# Patient Record
Sex: Female | Born: 1990
Health system: Southern US, Community
[De-identification: ages and names within clinical notes are randomized; demographics above are authoritative.]

## PROBLEM LIST (undated history)

## (undated) ENCOUNTER — Inpatient Hospital Stay (HOSPITAL_COMMUNITY): Payer: 59

---
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
## 2009-03-17 ENCOUNTER — Emergency Department (HOSPITAL_COMMUNITY): Admission: EM | Admit: 2009-03-17 | Discharge: 2009-03-17 | Payer: Self-pay | Admitting: Emergency Medicine

## 2010-07-13 LAB — URINALYSIS, ROUTINE W REFLEX MICROSCOPIC
Glucose, UA: NEGATIVE mg/dL
Ketones, ur: NEGATIVE mg/dL
Nitrite: NEGATIVE
Specific Gravity, Urine: 1.021 (ref 1.005–1.030)

## 2010-07-13 LAB — COMPREHENSIVE METABOLIC PANEL
ALT: 14 U/L (ref 0–35)
AST: 19 U/L (ref 0–37)
BUN: 8 mg/dL (ref 6–23)
CO2: 28 mEq/L (ref 19–32)
Calcium: 9.2 mg/dL (ref 8.4–10.5)
GFR calc Af Amer: >60 mL/min (ref >60)
GFR calc non Af Amer: >60 mL/min (ref >60)
Sodium: 137 mEq/L (ref 135–145)
Total Bilirubin: 1.2 mg/dL (ref 0.3–1.2)

## 2010-07-13 LAB — CBC
MCHC: 34.2 g/dL (ref 30.0–36.0)
RBC: 4.88 MIL/uL (ref 3.87–5.11)

## 2010-07-13 LAB — DIFFERENTIAL
Eosinophils Absolute: 0.2 10*3/uL (ref 0.0–0.7)
Eosinophils Relative: 4 % (ref 0–5)
Lymphocytes Relative: 18 % (ref 12–46)
Neutro Abs: 3.6 10*3/uL (ref 1.7–7.7)

## 2012-08-27 ENCOUNTER — Emergency Department (HOSPITAL_COMMUNITY)
Admission: EM | Admit: 2012-08-27 | Discharge: 2012-08-27 | Disposition: A | Discharge status: Home / Self Care | Payer: BC Managed Care – PPO | Attending: Emergency Medicine | Admitting: Emergency Medicine

## 2012-08-27 ENCOUNTER — Emergency Department (HOSPITAL_COMMUNITY): Payer: BC Managed Care – PPO

## 2012-08-27 DIAGNOSIS — Y9389 Activity, other specified: Secondary | ICD-10-CM | POA: Insufficient documentation

## 2012-08-27 DIAGNOSIS — S8990XA Unspecified injury of unspecified lower leg, initial encounter: Secondary | ICD-10-CM | POA: Insufficient documentation

## 2012-08-27 DIAGNOSIS — Y92410 Unspecified street and highway as the place of occurrence of the external cause: Secondary | ICD-10-CM | POA: Insufficient documentation

## 2012-08-27 DIAGNOSIS — R079 Chest pain, unspecified: Secondary | ICD-10-CM

## 2012-08-27 DIAGNOSIS — S59909A Unspecified injury of unspecified elbow, initial encounter: Secondary | ICD-10-CM | POA: Insufficient documentation

## 2012-08-27 DIAGNOSIS — M25531 Pain in right wrist: Secondary | ICD-10-CM

## 2012-08-27 DIAGNOSIS — M25571 Pain in right ankle and joints of right foot: Secondary | ICD-10-CM

## 2012-08-27 DIAGNOSIS — S6990XA Unspecified injury of unspecified wrist, hand and finger(s), initial encounter: Secondary | ICD-10-CM | POA: Insufficient documentation

## 2012-08-27 MED ORDER — HYDROCODONE-ACETAMINOPHEN 5-325 MG PO TABS
2.0000 | ORAL_TABLET | Freq: Once | ORAL | Status: AC
  Administered 2012-08-27: 2 via ORAL
  Filled 2012-08-27: qty 2

## 2012-08-27 MED ORDER — IBUPROFEN 600 MG PO TABS: 600.0000 mg | ORAL_TABLET | Freq: Four times a day (QID) | ORAL | Status: DC | PRN
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
## 2012-08-27 NOTE — ED Notes (Signed)
Pt escorted to discharge window. Pt verbalized understanding discharge instructions. In no acute distress.  

## 2012-08-27 NOTE — Progress Notes (Signed)
[facility] has seen patient and provided her with a list of primary care resources. °
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
## 2012-08-28 NOTE — ED Provider Notes (Signed)
Medical screening examination/treatment/procedure(s) were performed by non-physician practitioner and as supervising physician I was immediately available for consultation/collaboration.   Jafar Poffenberger R Colene [place]ines, MD 08/28/12 1611 

## 2014-12-16 ENCOUNTER — Encounter (HOSPITAL_COMMUNITY): Payer: Self-pay | Admitting: *Deleted

## 2014-12-16 ENCOUNTER — Other Ambulatory Visit (HOSPITAL_COMMUNITY)
Admission: RE | Admit: 2014-12-16 | Discharge: 2014-12-16 | Disposition: A | Discharge status: Home / Self Care | Payer: Self-pay | Source: Ambulatory Visit | Attending: Family Medicine | Admitting: Family Medicine

## 2014-12-16 ENCOUNTER — Emergency Department (INDEPENDENT_AMBULATORY_CARE_PROVIDER_SITE_OTHER)
Admission: EM | Admit: 2014-12-16 | Discharge: 2014-12-16 | Disposition: A | Discharge status: Home / Self Care | Payer: Self-pay | Source: Home / Self Care | Attending: Family Medicine | Admitting: Family Medicine

## 2014-12-16 DIAGNOSIS — Z113 Encounter for screening for infections with a predominantly sexual mode of transmission: Secondary | ICD-10-CM | POA: Insufficient documentation

## 2014-12-16 DIAGNOSIS — B9689 Other specified bacterial agents as the cause of diseases classified elsewhere: Secondary | ICD-10-CM

## 2014-12-16 DIAGNOSIS — A499 Bacterial infection, unspecified: Secondary | ICD-10-CM

## 2014-12-16 DIAGNOSIS — N760 Acute vaginitis: Secondary | ICD-10-CM

## 2014-12-16 MED ORDER — METRONIDAZOLE 500 MG PO TABS: 500.0000 mg | ORAL_TABLET | Freq: Two times a day (BID) | ORAL | Status: DC

## 2014-12-16 NOTE — ED Provider Notes (Addendum)
CSN: [id number]     Arrival date & time 12/16/14  1727 History   First MD Initiated Contact with Patient 12/16/14 1848     Chief Complaint  Patient presents with  . Vaginal Discharge   (Consider location/radiation/quality/duration/timing/severity/associated sxs/prior Treatment) Patient is a 24 y.o. female presenting with vaginal discharge. The history is provided by the patient.  Vaginal Discharge Quality:  Malodorous Severity:  Mild Onset quality:  Gradual Duration:  2 weeks Progression:  Unchanged Chronicity:  Recurrent Context comment:  Similar sx of bv in past. Relieved by:  None tried Worsened by:  Nothing tried Ineffective treatments:  None tried Associated symptoms: no abdominal pain, no dysuria, no fever, no genital lesions and no nausea   Risk factors comment:  S/p recent abortion procedure.   History reviewed. No pertinent past medical history. History reviewed. No pertinent past surgical history. History reviewed. No pertinent family history. Social History  Substance Use Topics  . Smoking status: None  . Smokeless tobacco: None  . Alcohol Use: No   OB History    No data available     Review of Systems  Constitutional: Negative for fever.  Gastrointestinal: Negative for nausea and abdominal pain.  Genitourinary: Positive for vaginal discharge. Negative for dysuria, urgency, vaginal bleeding and pelvic pain.  All other systems reviewed and are negative.   Allergies  Review of patient's allergies indicates no known allergies.  Home Medications   Prior to Admission medications   Medication Sig Start Date End Date Taking? Authorizing Provider  ibuprofen (ADVIL,MOTRIN) 600 MG tablet Take 1 tablet (600 mg total) by mouth every 6 (six) hours as needed for pain. 08/27/12   Junius Finner, PA-C  metroNIDAZOLE (FLAGYL) 500 MG tablet Take 1 tablet (500 mg total) by mouth 2 (two) times daily. 12/16/14   Linna Hoff, MD   Meds Ordered and Administered this Visit   Medications - No data to display  BP 107/74 mmHg  Pulse 69  Temp(Src) 99.2 F (37.3 C) (Oral)  Resp 16  SpO2 98%  LMP 12/02/2014 No data found.   Physical Exam  Constitutional: She is oriented to person, place, and time. She appears well-developed and well-nourished.  Abdominal: Soft. Bowel sounds are normal. There is no tenderness.  Genitourinary: Uterus normal. Pelvic exam was performed with patient supine. Cervix exhibits discharge. Right adnexum displays no tenderness. Left adnexum displays no tenderness. No tenderness in the vagina. Vaginal discharge found.  Neurological: She is alert and oriented to person, place, and time.  Skin: Skin is warm and dry.  Nursing note and vitals reviewed.   ED Course  Procedures (including critical care time)  Labs Review Labs Reviewed  CERVICOVAGINAL ANCILLARY ONLY    Imaging Review No results found.   Visual Acuity Review  Right Eye Distance:   Left Eye Distance:   Bilateral Distance:    Right Eye Near:   Left Eye Near:    Bilateral Near:         MDM   1. BV (bacterial vaginosis)    rx with flagyl for bv.    Linna Hoff, MD 12/16/14 [place]  Linna Hoff, MD 12/16/14 [phone]

## 2014-12-16 NOTE — ED Notes (Signed)
Pt  Reports   Symptoms  Of  Vaginal  Discharge  And  Odor  X  2  Weeks

## 2014-12-17 LAB — CERVICOVAGINAL ANCILLARY ONLY
Chlamydia: NEGATIVE
Neisseria Gonorrhea: NEGATIVE
Wet Prep (BD Affirm): POSITIVE — AB

## 2014-12-17 NOTE — ED Notes (Addendum)
Attempted to call patient to advise of test results. Positive for gardnerella only, negative for GC/chlamydis, trichomonas. Treatment adequate w Rx provided day of visit.  VM box is full and unable to leave a message.

## 2015-06-15 ENCOUNTER — Encounter (HOSPITAL_COMMUNITY): Payer: Self-pay | Admitting: *Deleted

## 2015-06-15 ENCOUNTER — Emergency Department (HOSPITAL_COMMUNITY)
Admission: EM | Admit: 2015-06-15 | Discharge: 2015-06-15 | Disposition: A | Discharge status: Home / Self Care | Payer: Self-pay | Attending: Emergency Medicine | Admitting: Emergency Medicine

## 2015-06-15 DIAGNOSIS — Z3491 Encounter for supervision of normal pregnancy, unspecified, first trimester: Secondary | ICD-10-CM | POA: Insufficient documentation

## 2015-06-15 DIAGNOSIS — Z3A01 Less than 8 weeks gestation of pregnancy: Secondary | ICD-10-CM | POA: Insufficient documentation

## 2015-06-15 NOTE — ED Notes (Signed)
Pt denies any pain or discharge, reports being approx 6 weeks 6 days pregnant and went for US today at pregnancy center. Pt was told there was no heartbeat on US and sent here for further eval. No acute distress noted at triage. Pt refused blooddraw at triage.

## 2015-06-15 NOTE — ED Notes (Signed)
No answer in waiting.  

## 2015-06-15 NOTE — ED Notes (Signed)
No answer x3

## 2015-06-18 ENCOUNTER — Inpatient Hospital Stay (HOSPITAL_COMMUNITY)
Admission: AD | Admit: 2015-06-18 | Discharge: 2015-06-18 | Disposition: A | Discharge status: Home / Self Care | Payer: Self-pay | Source: Ambulatory Visit | Attending: Obstetrics & Gynecology | Admitting: Obstetrics & Gynecology

## 2015-06-18 DIAGNOSIS — O26891 Other specified pregnancy related conditions, first trimester: Secondary | ICD-10-CM | POA: Insufficient documentation

## 2015-06-18 DIAGNOSIS — Z3200 Encounter for pregnancy test, result unknown: Secondary | ICD-10-CM

## 2015-06-18 DIAGNOSIS — Z3A01 Less than 8 weeks gestation of pregnancy: Secondary | ICD-10-CM | POA: Insufficient documentation

## 2015-06-18 NOTE — MAU Note (Addendum)
Patient presents stating that [facility] to confirm her pregnancy via +UPT. She then returned on March 15 for an ultrasound to confirm a heartbeat but they were unable to detect FHT's. Was instructed to seek medical help from a physician. [facility] and was registered but left when they wanted to draw blood. Denies pain, bleeding or discharge. Presents today for an U/S.

## 2015-06-18 NOTE — MAU Provider Note (Signed)
S:  Alexandria Ray is a 25 y.o. female No obstetric history on file. Presenting with to MAU requesting an US to detect fetal heart tones. The patient denies pain or vaginal bleeding at this time. The [facility] and was told that they could not detect fetal hear tones. The patient thinks she is about [redacted] weeks pregnant according to her LMP.   O:  GENERAL: Well-developed, well-nourished female in no acute distress.  LUNGS: Effort normal SKIN: Warm, dry and without erythema PSYCH: Normal mood and affect  Filed Vitals:   06/18/15 1417  BP: 116/68  Pulse: 84  Temp: 98.5 F (36.9 C)  TempSrc: Oral  Resp: 16  Height: 5\' 4"  (1.626 m)  Weight: 157 lb 4 oz (71.328 kg)    A:   Patient is physically well however worried about her pregnancy  P:  The patient is encouraged to call the [facility] on Monday to schedule an appointment for 1st prenatal appt.  If patient develops pain or bleeding, she is encouraged to come back to MAU for evaluation.   Duane LopeJennifer I Rasch, NP 06/18/2015 2:37 PM

## 2015-10-20 ENCOUNTER — Emergency Department (HOSPITAL_COMMUNITY)
Admission: EM | Admit: 2015-10-20 | Discharge: 2015-10-20 | Disposition: A | Discharge status: Home / Self Care | Payer: Self-pay | Attending: Dermatology | Admitting: Dermatology

## 2015-10-20 ENCOUNTER — Emergency Department (HOSPITAL_COMMUNITY): Payer: Self-pay

## 2015-10-20 ENCOUNTER — Encounter (HOSPITAL_COMMUNITY): Payer: Self-pay | Admitting: Emergency Medicine

## 2015-10-20 DIAGNOSIS — Z5321 Procedure and treatment not carried out due to patient leaving prior to being seen by health care provider: Secondary | ICD-10-CM | POA: Insufficient documentation

## 2015-10-20 DIAGNOSIS — R079 Chest pain, unspecified: Secondary | ICD-10-CM | POA: Insufficient documentation

## 2015-10-20 NOTE — ED Notes (Signed)
Pt. reports right chest pain radiating to upper back onset today with mild SOB , denies nausea or diaphoresis .

## 2015-10-20 NOTE — ED Notes (Signed)
Pt's name called, no answer. Pt is not seen in the lobby.  

## 2015-10-20 NOTE — ED Notes (Signed)
Patient was called but didn't answer.Marland Kitchen..Marland Kitchen

## 2016-03-10 ENCOUNTER — Encounter (HOSPITAL_COMMUNITY): Payer: Self-pay | Admitting: Emergency Medicine

## 2016-03-10 ENCOUNTER — Emergency Department (HOSPITAL_COMMUNITY)
Admission: EM | Admit: 2016-03-10 | Discharge: 2016-03-10 | Disposition: A | Discharge status: Home / Self Care | Payer: Self-pay | Attending: Emergency Medicine | Admitting: Emergency Medicine

## 2016-03-10 DIAGNOSIS — L259 Unspecified contact dermatitis, unspecified cause: Secondary | ICD-10-CM | POA: Insufficient documentation

## 2016-03-10 MED ORDER — PREDNISONE 20 MG PO TABS: ORAL_TABLET | ORAL | 0 refills | Status: DC

## 2016-03-10 MED ORDER — DIPHENHYDRAMINE HCL 25 MG PO TABS: 25.0000 mg | ORAL_TABLET | Freq: Four times a day (QID) | ORAL | 0 refills | Status: DC | PRN

## 2016-03-10 NOTE — Discharge Instructions (Signed)
Please avoid potential factors that may cause your skin sensitivity, such as new uniform.  Please follow up with an allergist for further evaluation of your condition.

## 2016-03-10 NOTE — ED Triage Notes (Signed)
Patient reports rash to central chest and left eye x1 month. States she first noticed the rash when she started her job x1 month ago. 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

## 2016-03-10 NOTE — ED Provider Notes (Signed)
[facility]-EMERGENCY DEPT Provider Note   CSN: [id number] Arrival date & time: 03/10/16  1503  By signing my name below, I, Teofilo PodMatthew P. Jamison, attest that this documentation has been prepared under the direction and in the presence of Fayrene HelperBowie Calia Napp, PA-C. Electronically Signed: Teofilo PodMatthew P. Jamison, ED Scribe. 03/10/2016. 4:50 PM.    History   Chief Complaint Chief Complaint  Patient presents with  . Rash    The history is provided by the patient. No language interpreter was used.   HPI Comments:  Alexandria Ray is a 25 y.o. female who presents to the Emergency Department complaining of a constant rash to her central chest and left eye x 5 days. Pt states that she first noticed the rash ~5 days ago on her chest. Pt states that her neck, right shoulder and right axilla are itchy, and her chest is in pain. Pt notes that her rash pain is exacerbated when trying to wash it and when touching the area. Pt complains of associated SOB, muscles aches near the rash areas, general fatigue, headache. 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 Pt also complains of lower back pain x 2 days. Pt denies having used any new soaps or detergents. Pt reports no previous similar allergic reactions. No alleviating factors noted. Pt denies joint pain, wheezing.   History reviewed. No pertinent past medical history.  There are no active problems to display for this patient.   History reviewed. No pertinent surgical history.  OB History    No data available       Home Medications    Prior to Admission medications   Medication Sig Start Date End Date Taking? Authorizing Provider  ibuprofen (ADVIL,MOTRIN) 600 MG tablet Take 1 tablet (600 mg total) by mouth every 6 (six) hours as needed for pain. 08/27/12   Junius FinnerErin O'Malley, PA-C  metroNIDAZOLE (FLAGYL) 500 MG tablet Take 1 tablet (500 mg total) by mouth 2  (two) times daily. 12/16/14   Linna HoffJames D Kindl, MD    Family History History reviewed. No pertinent family history.  Social History Social History  Substance Use Topics  . Smoking status: Never Smoker  . Smokeless tobacco: Never Used  . Alcohol use No     Allergies   Patient has no known allergies.   Review of Systems Review of Systems  Constitutional: Positive for fatigue.  Respiratory: Positive for shortness of breath. Negative for wheezing.   Musculoskeletal: Positive for back pain and myalgias. Negative for arthralgias.  Skin: Positive for rash.  Neurological: Positive for headaches.     Physical Exam Updated Vital Signs BP 114/74 (BP Location: Right Arm)   Pulse 87   Temp 97.8 F (36.6 C) (Oral)   Resp 18   LMP 03/02/2016   SpO2 100%   Physical Exam  Constitutional: She appears well-developed and well-nourished. No distress.  HENT:  Head: Normocephalic and atraumatic.  No oral or mucosal rash.   Eyes: Conjunctivae are normal.  Small mildly hyperpigmented lesion to lateral canthus of left eye without significant erythema.   Cardiovascular: Normal rate and regular rhythm.   Pulmonary/Chest: Effort normal and breath sounds normal.  Abdominal: She exhibits no distension.  Neurological: She is alert.  Skin: Skin is warm and dry.  Hyperpigmented abrasion noted to anterior child with mild TTP. Mild hyperpigmented lesion noted to right axillary fold, mildly TTP.   Psychiatric:  She has a normal mood and affect.  Nursing note and vitals reviewed.    ED Treatments / Results  DIAGNOSTIC STUDIES:  Oxygen Saturation is 100% on RA, normal by my interpretation.    COORDINATION OF CARE:  4:50 PM Discussed treatment plan with pt at bedside and pt agreed to plan.   Labs (all labs ordered are listed, but only abnormal results are displayed) Labs Reviewed - No data to display  EKG  EKG Interpretation None       Radiology No results  found.  Procedures Procedures (including critical care time)  Medications Ordered in ED Medications - No data to display   Initial Impression / Assessment and Plan / ED Course   Pt with sxs suggestive of contact dermatitis, likely from new uniform.    Instructed to avoid offending agent and to use unscented soaps, lotions, and detergents. Will treat with steroid and benadryl.  No signs of secondary infection. Follow up with allergist in 2-3 days. Return precautions discussed. Pt is safe for discharge at this time.   I have reviewed the triage vital signs and the nursing notes.  Pertinent labs & imaging results that were available during my care of the patient were reviewed by me and considered in my medical decision making (see chart for details).  Clinical Course     BP 114/74 (BP Location: Right Arm)   Pulse 87   Temp 97.8 F (36.6 C) (Oral)   Resp 18   LMP 03/02/2016   SpO2 100%    Final Clinical Impressions(s) / ED Diagnoses   Final diagnoses:  Contact dermatitis, unspecified contact dermatitis type, unspecified trigger    New Prescriptions New Prescriptions   DIPHENHYDRAMINE (BENADRYL) 25 MG TABLET    Take 1 tablet (25 mg total) by mouth every 6 (six) hours as needed.   PREDNISONE (DELTASONE) 20 MG TABLET    2 tabs po daily x 3 days   I personally performed the services described in this documentation, which was scribed in my presence. The recorded information has been reviewed and is accurate.       Fayrene HelperBowie Nishi Neiswonger, PA-C 03/10/16 1719    Lorre NickAnthony Allen, MD 03/14/16 [phone]

## 2016-03-12 ENCOUNTER — Ambulatory Visit: Payer: Self-pay

## 2016-03-12 MED FILL — predniSONE 20 MG TABS: 20 | 3 days supply | Qty: 6 | Fill #0

## 2016-04-17 ENCOUNTER — Ambulatory Visit: Payer: Self-pay | Admitting: Allergy and Immunology

## 2016-05-30 ENCOUNTER — Ambulatory Visit (HOSPITAL_COMMUNITY)
Admission: EM | Admit: 2016-05-30 | Discharge: 2016-05-30 | Disposition: A | Discharge status: Home / Self Care | Payer: Self-pay | Attending: Family Medicine | Admitting: Family Medicine

## 2016-05-30 DIAGNOSIS — N760 Acute vaginitis: Secondary | ICD-10-CM

## 2016-05-30 DIAGNOSIS — B9689 Other specified bacterial agents as the cause of diseases classified elsewhere: Secondary | ICD-10-CM

## 2016-05-30 DIAGNOSIS — N939 Abnormal uterine and vaginal bleeding, unspecified: Secondary | ICD-10-CM

## 2016-05-30 LAB — POCT PREGNANCY, URINE: PREG TEST UR: NEGATIVE

## 2016-05-30 MED ORDER — METRONIDAZOLE 500 MG PO TABS: 500.0000 mg | ORAL_TABLET | Freq: Two times a day (BID) | ORAL | 0 refills | Status: DC

## 2016-05-30 MED ORDER — FLUCONAZOLE 150 MG PO TABS: ORAL_TABLET | ORAL | 0 refills | Status: DC

## 2016-05-30 MED FILL — FLUCONAZOLE 150 MG TABLET: 150 | 2 days supply | Qty: 2 | Fill #0

## 2016-05-30 MED FILL — metroNIDAZOLE 500 MG TABS: 500 | 7 days supply | Qty: 14 | Fill #0

## 2016-05-30 NOTE — ED Triage Notes (Signed)
C/o vaginal bleeding this month States menstrual cycle was irregular

## 2016-05-30 NOTE — Discharge Instructions (Signed)
Follow-up with primary care doctor as above. He may need additional testing and possibly a pelvic ultrasound. He will also need follow-up after diagnosis and treatment. Take the medications as directed.

## 2016-05-30 NOTE — ED Provider Notes (Signed)
CSN: [id number]     Arrival date & time 05/30/16  1434 History   First MD Initiated Contact with Patient 05/30/16 1556     Chief Complaint  Patient presents with  . Vaginal Bleeding   (Consider location/radiation/quality/duration/timing/severity/associated sxs/prior Treatment) 26 year old female presents to the urgent care today for vaginal bleeding. She states that she has been having regular periods however the last 2-3 months she has been having heavier menses with flows up to 2 weeks. She states her menses began at first of this month, lasted 2 weeks and stopped. Today she developed a light spotting and decided to be evaluated. In addition, she has a malodorous vaginal discharge which is also scant and consistent with her previous episodes of BV. Denies pelvic pain.      No past medical history on file. No past surgical history on file. No family history on file. Social History  Substance Use Topics  . Smoking status: Never Smoker  . Smokeless tobacco: Never Used  . Alcohol use No   OB History    No data available     Review of Systems  Constitutional: Negative.   HENT: Negative.   Respiratory: Negative.   Gastrointestinal: Negative.   Genitourinary: Positive for menstrual problem and vaginal discharge. Negative for dysuria, frequency, hematuria, pelvic pain and urgency.  Musculoskeletal: Negative.   Neurological: Negative.   All other systems reviewed and are negative.   Allergies  Patient has no known allergies.  Home Medications   Prior to Admission medications   Medication Sig Start Date End Date Taking? Authorizing Provider  diphenhydrAMINE (BENADRYL) 25 MG tablet Take 1 tablet (25 mg total) by mouth every 6 (six) hours as needed. 03/10/16   Fayrene Helper, PA-C  fluconazole (DIFLUCAN) 150 MG tablet 1 tab po x 1. May repeat in 72 hours if no improvement 05/30/16   Hayden Rasmussen, NP  ibuprofen (ADVIL,MOTRIN) 600 MG tablet Take 1 tablet (600 mg total) by mouth every 6  (six) hours as needed for pain. 08/27/12   Junius Finner, PA-C  metroNIDAZOLE (FLAGYL) 500 MG tablet Take 1 tablet (500 mg total) by mouth 2 (two) times daily. X 7 days 05/30/16   Hayden Rasmussen, NP  predniSONE (DELTASONE) 20 MG tablet 2 tabs po daily x 3 days 03/10/16   Fayrene Helper, PA-C   Meds Ordered and Administered this Visit  Medications - No data to display  BP 98/83 (BP Location: Right Arm)   Pulse 91   Temp 98.7 F (37.1 C) (Oral)   Resp 16   SpO2 99%  No data found.   Physical Exam  Constitutional: She is oriented to person, place, and time. She appears well-developed and well-nourished. No distress.  Eyes: EOM are normal.  Neck: Neck supple.  Cardiovascular: Normal rate, regular rhythm and normal heart sounds.   Pulmonary/Chest: Effort normal and breath sounds normal.  Abdominal: Soft. There is no tenderness.  Palpation of the anterior pelvis warts/suprapubic reveals no tenderness or rebound.  Musculoskeletal: She exhibits no edema.  Neurological: She is alert and oriented to person, place, and time.  Skin: Skin is warm and dry.  Psychiatric: She has a normal mood and affect.  Nursing note and vitals reviewed.   Urgent Care Course     Procedures (including critical care time)  Labs Review Labs Reviewed - No data to display  Imaging Review No results found.   Visual Acuity Review  Right Eye Distance:   Left Eye Distance:   Bilateral Distance:  Right Eye Near:   Left Eye Near:    Bilateral Near:         MDM   1. Abnormal uterine bleeding   2. Bacterial vaginosis    Follow-up with primary care doctor as above. He may need additional testing and possibly a pelvic ultrasound. He will also need follow-up after diagnosis and treatment. Take the medications as directed. Meds ordered this encounter  Medications  . metroNIDAZOLE (FLAGYL) 500 MG tablet    Sig: Take 1 tablet (500 mg total) by mouth 2 (two) times daily. X 7 days    Dispense:  14 tablet     Refill:  0    Order Specific Question:   Supervising Provider    Answer:   Linna HoffKINDL, Fenoglio D [phone][[id number]]  . fluconazole (DIFLUCAN) 150 MG tablet    Sig: 1 tab po x 1. May repeat in 72 hours if no improvement    Dispense:  2 tablet    Refill:  0    Order Specific Question:   Supervising Provider    Answer:   Linna HoffKINDL, Oubre D [[id number]]       Hayden Rasmussenavid Leonidus Rowand, NP 05/30/16 [phone]

## 2016-05-30 NOTE — ED Notes (Signed)
Verified phone number [phone]  Patient refused paperwork, requested papers to be shredded

## 2018-06-05 ENCOUNTER — Encounter: Payer: Self-pay | Admitting: Emergency Medicine

## 2018-06-05 ENCOUNTER — Ambulatory Visit (INDEPENDENT_AMBULATORY_CARE_PROVIDER_SITE_OTHER): Payer: Self-pay

## 2018-06-05 ENCOUNTER — Ambulatory Visit
Admission: EM | Admit: 2018-06-05 | Discharge: 2018-06-05 | Disposition: A | Discharge status: Home / Self Care | Payer: Self-pay | Attending: Family Medicine | Admitting: Family Medicine

## 2018-06-05 DIAGNOSIS — J101 Influenza due to other identified influenza virus with other respiratory manifestations: Secondary | ICD-10-CM

## 2018-06-05 LAB — POCT INFLUENZA A/B
INFLUENZA B, POC: NEGATIVE
Influenza A, POC: POSITIVE — AB

## 2018-06-05 MED ORDER — CETIRIZINE-PSEUDOEPHEDRINE ER 5-120 MG PO TB12: 1.0000 | ORAL_TABLET | Freq: Every day | ORAL | 0 refills | Status: DC

## 2018-06-05 MED ORDER — BENZONATATE 100 MG PO CAPS: 100.0000 mg | ORAL_CAPSULE | Freq: Three times a day (TID) | ORAL | 0 refills | Status: DC

## 2018-06-05 MED ORDER — FLUTICASONE PROPIONATE 50 MCG/ACT NA SUSP
2.0000 | Freq: Every day | NASAL | 0 refills | Status: DC
Start: 2018-06-05 — End: 2018-07-30

## 2018-06-05 NOTE — ED Provider Notes (Signed)
[facility]   [id number] 06/05/18 Arrival Time: 1618   CC: URI symptoms   SUBJECTIVE: History from: patient.  Alexandria Ray is a 28 y.o. female who presents with abrupt onset of improving chills, subjective fever, body aches, wet cough, sneezing, and fatigue x 3 days.  Admits to sick exposure to mother diagnosed with PNA.  Does work at [employer].  Has tried tylenol with relief.  Denies aggravating factors.  Denies previous symptoms in the past.  Complains of runny nose, SOB, and 1 episode of diarrhea. Denies sinus pain, wheezing, chest pain, nausea, changes in bladder habits.    Received flu shot this year: no.  ROS: As per HPI.  History reviewed. No pertinent past medical history. History reviewed. No pertinent surgical history. No Known Allergies No current facility-administered medications on file prior to encounter.    No current outpatient medications on file prior to encounter.   Social History   Socioeconomic History  . Marital status: Single    Spouse name: Not on file  . Number of children: Not on file  . Years of education: Not on file  . Highest education level: Not on file  Occupational History  . Not on file  Social Needs  . Financial resource strain: Not on file  . Food insecurity:    Worry: Not on file    Inability: Not on file  . Transportation needs:    Medical: Not on file    Non-medical: Not on file  Tobacco Use  . Smoking status: Never Smoker  . Smokeless tobacco: Never Used  Substance and Sexual Activity  . Alcohol use: No  . Drug use: Not on file  . Sexual activity: Not on file  Lifestyle  . Physical activity:    Days per week: Not on file    Minutes per session: Not on file  . Stress: Not on file  Relationships  . Social connections:    Talks on phone: Not on file    Gets together: Not on file    Attends religious service: Not on file    Active member of club or organization: Not on file    Attends meetings of clubs or  organizations: Not on file    Relationship status: Not on file  . Intimate partner violence:    Fear of current or ex partner: Not on file    Emotionally abused: Not on file    Physically abused: Not on file    Forced sexual activity: Not on file  Other Topics Concern  . Not on file  Social History Narrative  . Not on file   History reviewed. No pertinent family history.  OBJECTIVE:  Vitals:   06/05/18 1733  BP: 103/73  Pulse: 94  Resp: 16  Temp: 99.3 F (37.4 C)  TempSrc: Oral  SpO2: 98%     General appearance: alert; appears mildly fatigued, but nontoxic; speaking in full sentences and tolerating own secretions HEENT: NCAT; Ears: EACs clear, TMs pearly gray; Eyes: PERRL.  EOM grossly intact. Nose: nares patent without rhinorrhea, Throat: oropharynx clear, tonsils non erythematous or enlarged, uvula midline  Neck: supple without LAD Lungs: unlabored respirations, symmetrical air entry; cough: mild; no respiratory distress; CTAB Heart: regular rate and rhythm.  Radial pulses 2+ symmetrical bilaterally Skin: warm and dry Psychological: alert and cooperative; normal mood and affect  DIAGNOSTIC STUDIES:  Dg Chest 2 View  Result Date: 06/05/2018 CLINICAL DATA:  Nonproductive cough, chills, headache, soreness, fatigue EXAM: CHEST -  2 VIEW COMPARISON:  Chest x-ray dated 10/20/2015. FINDINGS: The heart size and mediastinal contours are within normal limits. Both lungs are clear. The visualized skeletal structures are unremarkable. IMPRESSION: No active cardiopulmonary disease.  No evidence of pneumonia. Electronically Signed   By: Bary Richard M.D.   On: 06/05/2018 18:56     ASSESSMENT & PLAN:  1. Influenza A     Meds ordered this encounter  Medications  . fluticasone (FLONASE) 50 MCG/ACT nasal spray    Sig: Place 2 sprays into both nostrils daily.    Dispense:  16 g    Refill:  0    Order Specific Question:   Supervising Provider    Answer:   Eustace Moore  [[id number]]  . cetirizine-pseudoephedrine (ZYRTEC-D) 5-120 MG tablet    Sig: Take 1 tablet by mouth daily.    Dispense:  30 tablet    Refill:  0    Order Specific Question:   Supervising Provider    Answer:   Eustace Moore [[id number]]  . benzonatate (TESSALON) 100 MG capsule    Sig: Take 1 capsule (100 mg total) by mouth every 8 (eight) hours.    Dispense:  21 capsule    Refill:  0    Order Specific Question:   Supervising Provider    Answer:   Eustace Moore [[id number]]   Chest x-ray did not show cardiopulmonary disease Flu A test was positive  Get plenty of rest and push fluids.  Drink at least half your body weight in ounces.  You may supplement with OTC Pedialyte or oral rehydration solution Zyrtec D and flonase prescribed.  Use daily for runny nose and/or congestion Tessalon Perles prescribed for cough You may continue with OTC medications as needed Use OTC tylenol and/or ibuprofen every 4 hours for fever, body aches, and chills Follow up with PCP or with Joaquin Courts FNP if symptoms persist Go to the ED if you have any new or worsening symptoms fever that does not moderate with tylenol, chills, nausea, vomiting, chest pain, worsening cough, shortness of breath, wheezing, abdominal pain, changes in bowel or bladder habits, etc...   Reviewed expectations re: course of current medical issues. Questions answered. Outlined signs and symptoms indicating need for more acute intervention. Patient verbalized understanding. After Visit Summary given.         Alvino Chapel Wardner, PA-C 06/06/18 [phone]

## 2018-06-05 NOTE — ED Triage Notes (Signed)
Pt presents to [facility] for assessment after being sick 3-4 weeks ago with a flu-like illness.  States she had been feeling better for approx 1 week, and then 2 days ago began to have intense lower back pain and leg pain, cough (non-productive), chills, night-sweats (denies hemoptysis).  Pt states she took 1-325 mg Tylenol this mprning at 11am.  Pt works at [employer] and concerned for possible corona virus exposure.

## 2018-06-05 NOTE — Discharge Instructions (Signed)
Chest x-ray did not show cardiopulmonary disease Flu A test was positive  Get plenty of rest and push fluids.  Drink at least half your body weight in ounces.  You may supplement with OTC Pedialyte or oral rehydration solution Zyrtec D and flonase prescribed.  Use daily for runny nose and/or congestion Tessalon Perles prescribed for cough You may continue with OTC medications as needed Use OTC tylenol and/or ibuprofen every 4 hours for fever, body aches, and chills Follow up with PCP or with Alexandria Courts FNP if symptoms persist Go to the ED if you have any new or worsening symptoms fever that does not moderate with tylenol, chills, nausea, vomiting, chest pain, worsening cough, shortness of breath, wheezing, abdominal pain, changes in bowel or bladder habits, etc..Marland Kitchen

## 2018-06-05 NOTE — ED Notes (Signed)
Patient able to ambulate independently  

## 2018-06-06 MED FILL — FLUTICASONE PROP 50 MCG SPR: 50 | 30 days supply | Qty: 16 | Fill #0

## 2018-06-06 MED FILL — BENZONATATE 100 MG CAPS: 100 | 7 days supply | Qty: 21 | Fill #0

## 2018-07-30 ENCOUNTER — Other Ambulatory Visit: Payer: Self-pay

## 2018-07-30 ENCOUNTER — Ambulatory Visit
Admission: EM | Admit: 2018-07-30 | Discharge: 2018-07-30 | Disposition: A | Discharge status: Home / Self Care | Payer: Self-pay | Attending: Family Medicine | Admitting: Family Medicine

## 2018-07-30 ENCOUNTER — Encounter: Payer: Self-pay | Admitting: Emergency Medicine

## 2018-07-30 DIAGNOSIS — N898 Other specified noninflammatory disorders of vagina: Secondary | ICD-10-CM

## 2018-07-30 MED ORDER — METRONIDAZOLE 500 MG PO TABS: 500.0000 mg | ORAL_TABLET | Freq: Two times a day (BID) | ORAL | 0 refills | Status: DC

## 2018-07-30 MED ORDER — FLUCONAZOLE 150 MG PO TABS: 150.0000 mg | ORAL_TABLET | Freq: Every day | ORAL | 0 refills | Status: DC

## 2018-07-30 MED FILL — METRONIDAZOLE 500 MG TABS: 500 | 7 days supply | Qty: 14 | Fill #0

## 2018-07-30 MED FILL — FLUCONAZOLE 150 MG TABS: 150 | 2 days supply | Qty: 2 | Fill #0

## 2018-07-30 NOTE — Discharge Instructions (Addendum)
We are treating you for BV Flagyl 2 times a day for the next 7 days Make sure you do not drink any alcohol while taking this medication, you very sick. If your symptoms persist despite treatment you need to come in for a recheck.

## 2018-07-30 NOTE — ED Triage Notes (Signed)
Pt presents to [facility] for assessment of vaginal discharge and odors x 2 days.  Patient states she has a hx of BV, and this feels exactly the same.  Pt is sexually active, does not use birth control, LMP 07/06/18.

## 2018-07-30 NOTE — ED Notes (Signed)
Patient able to ambulate independently  

## 2018-07-30 NOTE — ED Provider Notes (Signed)
[facility]    CSN: [id number] Arrival date & time: 07/30/18  1520     History   Chief Complaint Chief Complaint  Patient presents with  . Vaginal Discharge    HPI Mackensi Imig is a 28 y.o. female.   Patient is a 28 year old female who presents today with approximately 2 days of vaginal discharge and odor.  Symptoms have been constant and remain the same.  She has not taken anything for her symptoms.  Reports this is consistent with bacterial vaginosis which she has had in the past.  She is sexually active with one partner.  Denies any concerns for STDs.  Does not use any birth control.  Her last menstrual period was 07/06/2018.  She denies any associated dysuria, hematuria, urinary frequency, pelvic pain, abdominal pain, flank pain.  No fevers.  No vaginal itching or irritation.  No rashes.  ROS per HPI      History reviewed. No pertinent past medical history.  There are no active problems to display for this patient.   History reviewed. No pertinent surgical history.  OB History   No obstetric history on file.      Home Medications    Prior to Admission medications   Medication Sig Start Date End Date Taking? Authorizing Provider  fluconazole (DIFLUCAN) 150 MG tablet Take 1 tablet (150 mg total) by mouth daily. 07/30/18   Dahlia Byes A, NP  metroNIDAZOLE (FLAGYL) 500 MG tablet Take 1 tablet (500 mg total) by mouth 2 (two) times daily. 07/30/18   Janace Aris, NP    Family History History reviewed. No pertinent family history.  Social History Social History   Tobacco Use  . Smoking status: Never Smoker  . Smokeless tobacco: Never Used  Substance Use Topics  . Alcohol use: No  . Drug use: Not on file     Allergies   Patient has no known allergies.   Review of Systems Review of Systems   Physical Exam Triage Vital Signs ED Triage Vitals  Enc Vitals Group     BP 07/30/18 1529 114/70     Pulse Rate 07/30/18 1529 77     Resp 07/30/18  1529 16     Temp 07/30/18 1529 98.1 F (36.7 C)     Temp Source 07/30/18 1529 Oral     SpO2 07/30/18 1529 99 %     Weight --      Height --      Head Circumference --      Peak Flow --      Pain Score 07/30/18 1530 0     Pain Loc --      Pain Edu? --      Excl. in GC? --    No data found.  Updated Vital Signs BP 114/70 (BP Location: Left Arm)   Pulse 77   Temp 98.1 F (36.7 C) (Oral)   Resp 16   SpO2 99%   Visual Acuity Right Eye Distance:   Left Eye Distance:   Bilateral Distance:    Right Eye Near:   Left Eye Near:    Bilateral Near:     Physical Exam Vitals signs and nursing note reviewed.  Constitutional:      General: She is not in acute distress.    Appearance: Normal appearance. She is not ill-appearing, toxic-appearing or diaphoretic.  HENT:     Head: Normocephalic.     Nose: Nose normal.     Mouth/Throat:  Pharynx: Oropharynx is clear.  Eyes:     Conjunctiva/sclera: Conjunctivae normal.  Neck:     Musculoskeletal: Normal range of motion.  Pulmonary:     Effort: Pulmonary effort is normal.  Abdominal:     Palpations: Abdomen is soft.     Tenderness: There is no abdominal tenderness.  Musculoskeletal: Normal range of motion.  Skin:    General: Skin is warm and dry.     Findings: No rash.  Neurological:     Mental Status: She is alert.  Psychiatric:        Mood and Affect: Mood normal.      UC Treatments / Results  Labs (all labs ordered are listed, but only abnormal results are displayed) Labs Reviewed - No data to display  EKG None  Radiology No results found.  Procedures Procedures (including critical care time)  Medications Ordered in UC Medications - No data to display  Initial Impression / Assessment and Plan / UC Course  I have reviewed the triage vital signs and the nursing notes.  Pertinent labs & imaging results that were available during my care of the patient were reviewed by me and considered in my medical  decision making (see chart for details).     Vaginal discharge  We will go ahead and treat for bacterial vaginosis based on symptoms and history Patient is denying concern for STDs today No need for testing at this time.  Instructed that if her symptoms continue despite treatment she will need to follow-up for recheck and testing  Final Clinical Impressions(s) / UC Diagnoses   Final diagnoses:  Vaginal discharge     Discharge Instructions     We are treating you for BV Flagyl 2 times a day for the next 7 days Make sure you do not drink any alcohol while taking this medication, you very sick. If your symptoms persist despite treatment you need to come in for a recheck.    ED Prescriptions    Medication Sig Dispense Auth. Provider   metroNIDAZOLE (FLAGYL) 500 MG tablet Take 1 tablet (500 mg total) by mouth 2 (two) times daily. 14 tablet Merel Santoli A, NP   fluconazole (DIFLUCAN) 150 MG tablet Take 1 tablet (150 mg total) by mouth daily. 2 tablet Dahlia ByesBast, Travon Crochet A, NP     Controlled Substance Prescriptions [place] Controlled Substance Registry consulted? Not Applicable   Janace ArisBast, Cailah Reach A, NP 07/30/18 1556

## 2018-09-04 ENCOUNTER — Encounter: Payer: Self-pay | Admitting: Emergency Medicine

## 2018-09-04 ENCOUNTER — Ambulatory Visit
Admission: EM | Admit: 2018-09-04 | Discharge: 2018-09-04 | Disposition: A | Discharge status: Home / Self Care | Payer: Self-pay

## 2018-09-04 DIAGNOSIS — R1031 Right lower quadrant pain: Secondary | ICD-10-CM

## 2018-09-04 NOTE — Discharge Instructions (Signed)
Given RLQ pain with nausea/vomiting, please go to the ED for further evaluation needed.

## 2018-09-04 NOTE — ED Triage Notes (Signed)
Pt presents to [facility] for assessment of RLQ pain with vomiting and diarrhea starting this morning.  Pt states she has taken midol pain medication without any relief.

## 2018-09-04 NOTE — ED Notes (Signed)
Patient able to ambulate independently  

## 2018-09-04 NOTE — ED Provider Notes (Signed)
[facility]    CSN: [id number] Arrival date & time: 09/04/18  1722     History   Chief Complaint Chief Complaint  Patient presents with  . Abdominal Pain    HPI Alexandria Ray is a 28 y.o. female.   [age] year female with RLQ pain starting this morning. She is unsure if she woke up with the pain, or if the pain woke her up. Pain is constant, cramping/sharp/stabbing pain, no obvious aggravating or alleviating pain. Nausea with one episode of vomiting. However, states had some food earlier and tolerated well. Denies fever, chills, night sweats. Denies urinary symptoms such frequency, dysuria, hematuria.  Denies vaginal discharge, itching.  She is sexually active with one female partner, no condom use. LMP 09/03/2018.  She denies regular cramping with cycles.  She does have some diarrhea, which is normal for her on her cycles took Midol with mild relief.  She took a pregnancy test yesterday with negative results.     History reviewed. No pertinent past medical history.  There are no active problems to display for this patient.   History reviewed. No pertinent surgical history.  OB History   No obstetric history on file.      Home Medications    Prior to Admission medications   Not on File    Family History History reviewed. No pertinent family history.  Social History Social History   Tobacco Use  . Smoking status: Never Smoker  . Smokeless tobacco: Never Used  Substance Use Topics  . Alcohol use: No  . Drug use: Not on file     Allergies   Patient has no known allergies.   Review of Systems Review of Systems  Reason unable to perform ROS: See HPI as above.     Physical Exam Triage Vital Signs ED Triage Vitals [09/04/18 1731]  Enc Vitals Group     BP 112/75     Pulse Rate 85     Resp 18     Temp 98.3 F (36.8 C)     Temp Source Oral     SpO2 98 %     Weight      Height      Head Circumference      Peak Flow      Pain Score 10     Pain  Loc      Pain Edu?      Excl. in GC?    No data found.  Updated Vital Signs BP 112/75 (BP Location: Left Arm)   Pulse 85   Temp 98.3 F (36.8 C) (Oral)   Resp 18   LMP 09/04/2018   SpO2 98%   Visual Acuity Right Eye Distance:   Left Eye Distance:   Bilateral Distance:    Right Eye Near:   Left Eye Near:    Bilateral Near:     Physical Exam Constitutional:      General: She is not in acute distress.    Appearance: She is well-developed. She is not ill-appearing, toxic-appearing or diaphoretic.  HENT:     Head: Normocephalic and atraumatic.  Eyes:     Conjunctiva/sclera: Conjunctivae normal.     Pupils: Pupils are equal, round, and reactive to light.  Cardiovascular:     Rate and Rhythm: Normal rate and regular rhythm.     Heart sounds: Normal heart sounds. No murmur. No friction rub. No gallop.   Pulmonary:     Effort: Pulmonary effort is normal.  Breath sounds: Normal breath sounds. No wheezing or rales.  Abdominal:     General: Bowel sounds are normal.     Palpations: Abdomen is soft.     Tenderness: There is abdominal tenderness in the right lower quadrant and suprapubic area. There is no right CVA tenderness, left CVA tenderness, guarding or rebound. Positive signs include McBurney's sign. Negative signs include Rovsing's sign, psoas sign and obturator sign.  Skin:    General: Skin is warm and dry.  Neurological:     Mental Status: She is alert and oriented to person, place, and time.  Psychiatric:        Behavior: Behavior normal.        Judgment: Judgment normal.     UC Treatments / Results  Labs (all labs ordered are listed, but only abnormal results are displayed) Labs Reviewed - No data to display  EKG None  Radiology No results found.  Procedures Procedures (including critical care time)  Medications Ordered in UC Medications - No data to display  Initial Impression / Assessment and Plan / UC Course  I have reviewed the triage vital  signs and the nursing notes.  Pertinent labs & imaging results that were available during my care of the patient were reviewed by me and considered in my medical decision making (see chart for details).    28 year old female comes in for 1 day history of right lower quadrant pain with nausea and one episode of vomiting.  Afebrile.  On exam, she is tender to quadrant and suprapubic region without guarding or rebound.  Offered further testing such as urine dipstick, pregnancy test.  However, discussed cannot rule out appendicitis.  Given patient already with negative pregnancy test at home, would like further evaluation.  Discharged in stable condition to the ED for further evaluation and management of right lower quadrant pain.  Final Clinical Impressions(s) / UC Diagnoses   Final diagnoses:  Abdominal pain, RLQ    ED Prescriptions    None        Belinda Fisher, PA-C 09/04/18 1802

## 2018-11-15 ENCOUNTER — Other Ambulatory Visit: Payer: Self-pay

## 2018-11-15 ENCOUNTER — Encounter: Payer: Self-pay | Admitting: Emergency Medicine

## 2018-11-15 ENCOUNTER — Ambulatory Visit
Admission: EM | Admit: 2018-11-15 | Discharge: 2018-11-15 | Disposition: A | Discharge status: Home / Self Care | Payer: BC Managed Care – PPO | Attending: Emergency Medicine | Admitting: Emergency Medicine

## 2018-11-15 DIAGNOSIS — N898 Other specified noninflammatory disorders of vagina: Secondary | ICD-10-CM

## 2018-11-15 LAB — POCT URINE PREGNANCY: Preg Test, Ur: NEGATIVE

## 2018-11-15 MED ORDER — METRONIDAZOLE 500 MG PO TABS: 500.0000 mg | ORAL_TABLET | Freq: Two times a day (BID) | ORAL | 0 refills | Status: DC

## 2018-11-15 MED ORDER — FLUCONAZOLE 200 MG PO TABS: 200.0000 mg | ORAL_TABLET | Freq: Once | ORAL | 0 refills | Status: AC

## 2018-11-15 NOTE — ED Provider Notes (Signed)
[facility]    CSN: [id number] Arrival date & time: 11/15/18  1049     History   Chief Complaint Chief Complaint  Patient presents with  . Vaginal Discharge    HPI Alexandria Ray is a 28 y.o. female with history of BV presenting for 1 week course of non-malodorous, thick vaginal discharge.  Patient states this is consistent with previous BV infections, last treated for similar symptoms on 07/30/2018 with Flagyl, followed by Diflucan as patient tends to get yeast infections status post antibiotic use.  Patient states this adequately treated her symptoms.  Patient denies abdominal, back, vaginal, pelvic pain, vaginal bleeding, urinary symptoms, previous STD.  Patient practicing appropriate vaginal hygiene.  LMP 7/20.    History reviewed. No pertinent past medical history.  There are no active problems to display for this patient.   History reviewed. No pertinent surgical history.  OB History   No obstetric history on file.      Home Medications    Prior to Admission medications   Medication Sig Start Date End Date Taking? Authorizing Provider  fluconazole (DIFLUCAN) 200 MG tablet Take 1 tablet (200 mg total) by mouth once for 1 dose. May repeat in 72 hours if needed 11/15/18 11/15/18  Hall-Potvin, [place], PA-C  metroNIDAZOLE (FLAGYL) 500 MG tablet Take 1 tablet (500 mg total) by mouth 2 (two) times daily. 11/15/18   Hall-Potvin, [place], PA-C    Family History No family history on file.  Social History Social History   Tobacco Use  . Smoking status: Never Smoker  . Smokeless tobacco: Never Used  Substance Use Topics  . Alcohol use: No  . Drug use: Not on file     Allergies   Patient has no known allergies.   Review of Systems Review of Systems  Constitutional: Negative for fatigue and fever.  HENT: Negative for ear pain, sinus pain, sore throat and voice change.   Eyes: Negative for pain, redness and visual disturbance.  Respiratory: Negative  for cough and shortness of breath.   Cardiovascular: Negative for chest pain and palpitations.  Gastrointestinal: Negative for abdominal pain, diarrhea and vomiting.  Genitourinary: Positive for vaginal discharge. Negative for dyspareunia, dysuria, flank pain, frequency, hematuria, pelvic pain, urgency, vaginal bleeding and vaginal pain.  Musculoskeletal: Negative for arthralgias and myalgias.  Skin: Negative for rash and wound.  Neurological: Negative for syncope and headaches.     Physical Exam Triage Vital Signs ED Triage Vitals  Enc Vitals Group     BP      Pulse      Resp      Temp      Temp src      SpO2      Weight      Height      Head Circumference      Peak Flow      Pain Score      Pain Loc      Pain Edu?      Excl. in [place]?    No data found.  Updated Vital Signs BP 118/87 (BP Location: Right Arm)   Pulse 79   Temp 98.7 F (37.1 C) (Oral)   Resp 16   LMP 10/20/2018   SpO2 97%   Visual Acuity Right Eye Distance:   Left Eye Distance:   Bilateral Distance:    Right Eye Near:   Left Eye Near:    Bilateral Near:     Physical Exam Constitutional:  General: She is not in acute distress. HENT:     Head: Normocephalic and atraumatic.  Eyes:     General: No scleral icterus.    Pupils: Pupils are equal, round, and reactive to light.  Cardiovascular:     Rate and Rhythm: Normal rate.  Pulmonary:     Effort: Pulmonary effort is normal.  Abdominal:     General: Bowel sounds are normal.     Palpations: Abdomen is soft.     Tenderness: There is no abdominal tenderness. There is no right CVA tenderness, left CVA tenderness or guarding.  Genitourinary:    Comments: Patient declined, self-swab performed Skin:    Coloration: Skin is not jaundiced or pale.  Neurological:     Mental Status: She is alert and oriented to person, place, and time.      UC Treatments / Results  Labs (all labs ordered are listed, but only abnormal results are displayed)  Labs Reviewed  POCT URINE PREGNANCY - Normal  CERVICOVAGINAL ANCILLARY ONLY    EKG   Radiology No results found.  Procedures Procedures (including critical care time)  Medications Ordered in UC Medications - No data to display  Initial Impression / Assessment and Plan / UC Course  I have reviewed the triage vital signs and the nursing notes.  Pertinent labs & imaging results that were available during my care of the patient were reviewed by me and considered in my medical decision making (see chart for details).     1.  Vaginal discharge Patient reporting symptoms similar to previous BV flare.  In April, no cervical vaginal swab performed at patient's request.  Willing to do so today given recurrence.  Will treat today with Flagyl, followed by Diflucan.  Discussed importance of discussing recurrent BV with PCP or GYN to avoid recurrent antibiotic use.  Return precautions discussed, patient verbalized understanding and is agreeable to plan. Final Clinical Impressions(s) / UC Diagnoses   Final diagnoses:  Vaginal discharge     Discharge Instructions     Take antibiotics as prescribed. Is important finish antibiotics completely. Follow-up with family medicine/GYN for further evaluation to discuss recurrent BV. Return for worsening discharge, vaginal pain, urinary symptoms.    ED Prescriptions    Medication Sig Dispense Auth. Provider   metroNIDAZOLE (FLAGYL) 500 MG tablet Take 1 tablet (500 mg total) by mouth 2 (two) times daily. 14 tablet Hall-Potvin, [place], PA-C   fluconazole (DIFLUCAN) 200 MG tablet Take 1 tablet (200 mg total) by mouth once for 1 dose. May repeat in 72 hours if needed 2 tablet Hall-Potvin, [place], PA-C     Controlled Substance Prescriptions [place] Controlled Substance Registry consulted? Not Applicable   Shea EvansHall-Potvin, Brittany, [place]PA-C 11/15/18 1201

## 2018-11-15 NOTE — ED Triage Notes (Signed)
Per pt she has been having a discharge for 1 week. No pain no odor,very thick and more than usual.

## 2018-11-15 NOTE — Discharge Instructions (Addendum)
Take antibiotics as prescribed. Is important finish antibiotics completely. Follow-up with family medicine/GYN for further evaluation to discuss recurrent BV. Return for worsening discharge, vaginal pain, urinary symptoms.

## 2018-11-19 LAB — CERVICOVAGINAL ANCILLARY ONLY
Bacterial vaginitis: POSITIVE — AB
Candida vaginitis: NEGATIVE
Chlamydia: NEGATIVE
Neisseria Gonorrhea: NEGATIVE
Trichomonas: NEGATIVE

## 2019-04-24 ENCOUNTER — Other Ambulatory Visit: Payer: Self-pay

## 2019-04-24 ENCOUNTER — Ambulatory Visit (INDEPENDENT_AMBULATORY_CARE_PROVIDER_SITE_OTHER)
Admission: RE | Admit: 2019-04-24 | Discharge: 2019-04-24 | Disposition: A | Discharge status: Home / Self Care | Payer: BC Managed Care – PPO | Source: Ambulatory Visit

## 2019-04-24 DIAGNOSIS — N760 Acute vaginitis: Secondary | ICD-10-CM | POA: Diagnosis not present

## 2019-04-24 DIAGNOSIS — B9689 Other specified bacterial agents as the cause of diseases classified elsewhere: Secondary | ICD-10-CM

## 2019-04-24 MED ORDER — FLUCONAZOLE 150 MG PO TABS: 150.0000 mg | ORAL_TABLET | Freq: Every day | ORAL | 0 refills | Status: AC

## 2019-04-24 MED ORDER — METRONIDAZOLE 500 MG PO TABS: 500.0000 mg | ORAL_TABLET | Freq: Two times a day (BID) | ORAL | 0 refills | Status: AC

## 2019-04-24 NOTE — ED Provider Notes (Signed)
Virtual Visit via Video Note:  Alexandria Ray  initiated request for Telemedicine visit with [facility] team. I connected with Alexandria Ray  on 04/24/2019 at 10:08 AM  for a synchronized telemedicine visit using a video enabled HIPPA compliant telemedicine application. I verified that I am speaking with Alexandria Ray  using two identifiers. Mickie Bail, NP  was physically located in a [facility] Urgent care site and Shawna Wearing was located at a different location.   The limitations of evaluation and management by telemedicine as well as the availability of in-person appointments were discussed. Patient was informed that she  may incur a bill ( including co-pay) for this virtual visit encounter. Alexandria Ray  expressed understanding and gave verbal consent to proceed with virtual visit.     History of Present Illness:Alexandria Ray  is a 29 y.o. female presents for evaluation of malodorous white vaginal discharge x 1 day.  Patient has a history of recurrent bacterial vaginitis and this symptoms are her usual with BV.  She denies abdominal, dysuria, back pain, pelvic pain, rash, lesions, or others symptoms.  She denies pregnancy or breastfeeding; LMP: 04/09/2019   No Known Allergies   History reviewed. No pertinent past medical history.   Social History   Tobacco Use  . Smoking status: Never Smoker  . Smokeless tobacco: Never Used  Substance Use Topics  . Alcohol use: No  . Drug use: Not on file        Observations/Objective: Physical Exam  VITALS: Patient denies fever. GENERAL: Alert, appears well and in no acute distress. HEENT: Atraumatic. NECK: Normal movements of the head and neck. CARDIOPULMONARY: No increased WOB. Speaking in clear sentences. I:E ratio WNL.  MS: Moves all visible extremities without noticeable abnormality. PSYCH: Pleasant and cooperative, well-groomed. Speech normal rate and rhythm. Affect is appropriate. Insight and judgement are appropriate. Attention is  focused, linear, and appropriate.  NEURO: CN grossly intact. Oriented as arrived to appointment on time with no prompting. Moves both UE equally.  SKIN: No obvious lesions, wounds, erythema, or cyanosis noted on face or hands.    Assessment and Plan:    ICD-10-CM   1. Bacterial vaginitis  N76.0    B96.89        Follow Up Instructions: Treating with Diflucan and Flagyl.  Instructed patient to come in to be seen or follow-up with her PCP if her symptoms are not improving.  Patient agrees to plan of care.     I discussed the assessment and treatment plan with the patient. The patient was provided an opportunity to ask questions and all were answered. The patient agreed with the plan and demonstrated an understanding of the instructions.   The patient was advised to call back or seek an in-person evaluation if the symptoms worsen or if the condition fails to improve as anticipated.      Mickie Bail, NP  04/24/2019 10:08 AM         Mickie Bail, NP 04/24/19 1010

## 2019-04-24 NOTE — Discharge Instructions (Signed)
Take the metronidazole and fluconazole as directed.    Come here to be seen in person or follow-up with your primary care provider if your symptoms are not improving.

## 2019-09-22 ENCOUNTER — Other Ambulatory Visit: Payer: Self-pay | Admitting: Obstetrics and Gynecology

## 2019-09-22 DIAGNOSIS — N6310 Unspecified lump in the right breast, unspecified quadrant: Secondary | ICD-10-CM

## 2019-09-29 IMAGING — DX DG CHEST 2V
2 series · 2 of 2 positions shown · non-contrast
Comparison: Chest x-ray dated 10/20/2015.

CLINICAL DATA: Nonproductive cough, chills, headache, soreness,
fatigue

EXAM:
CHEST - 2 VIEW

[chest pa]
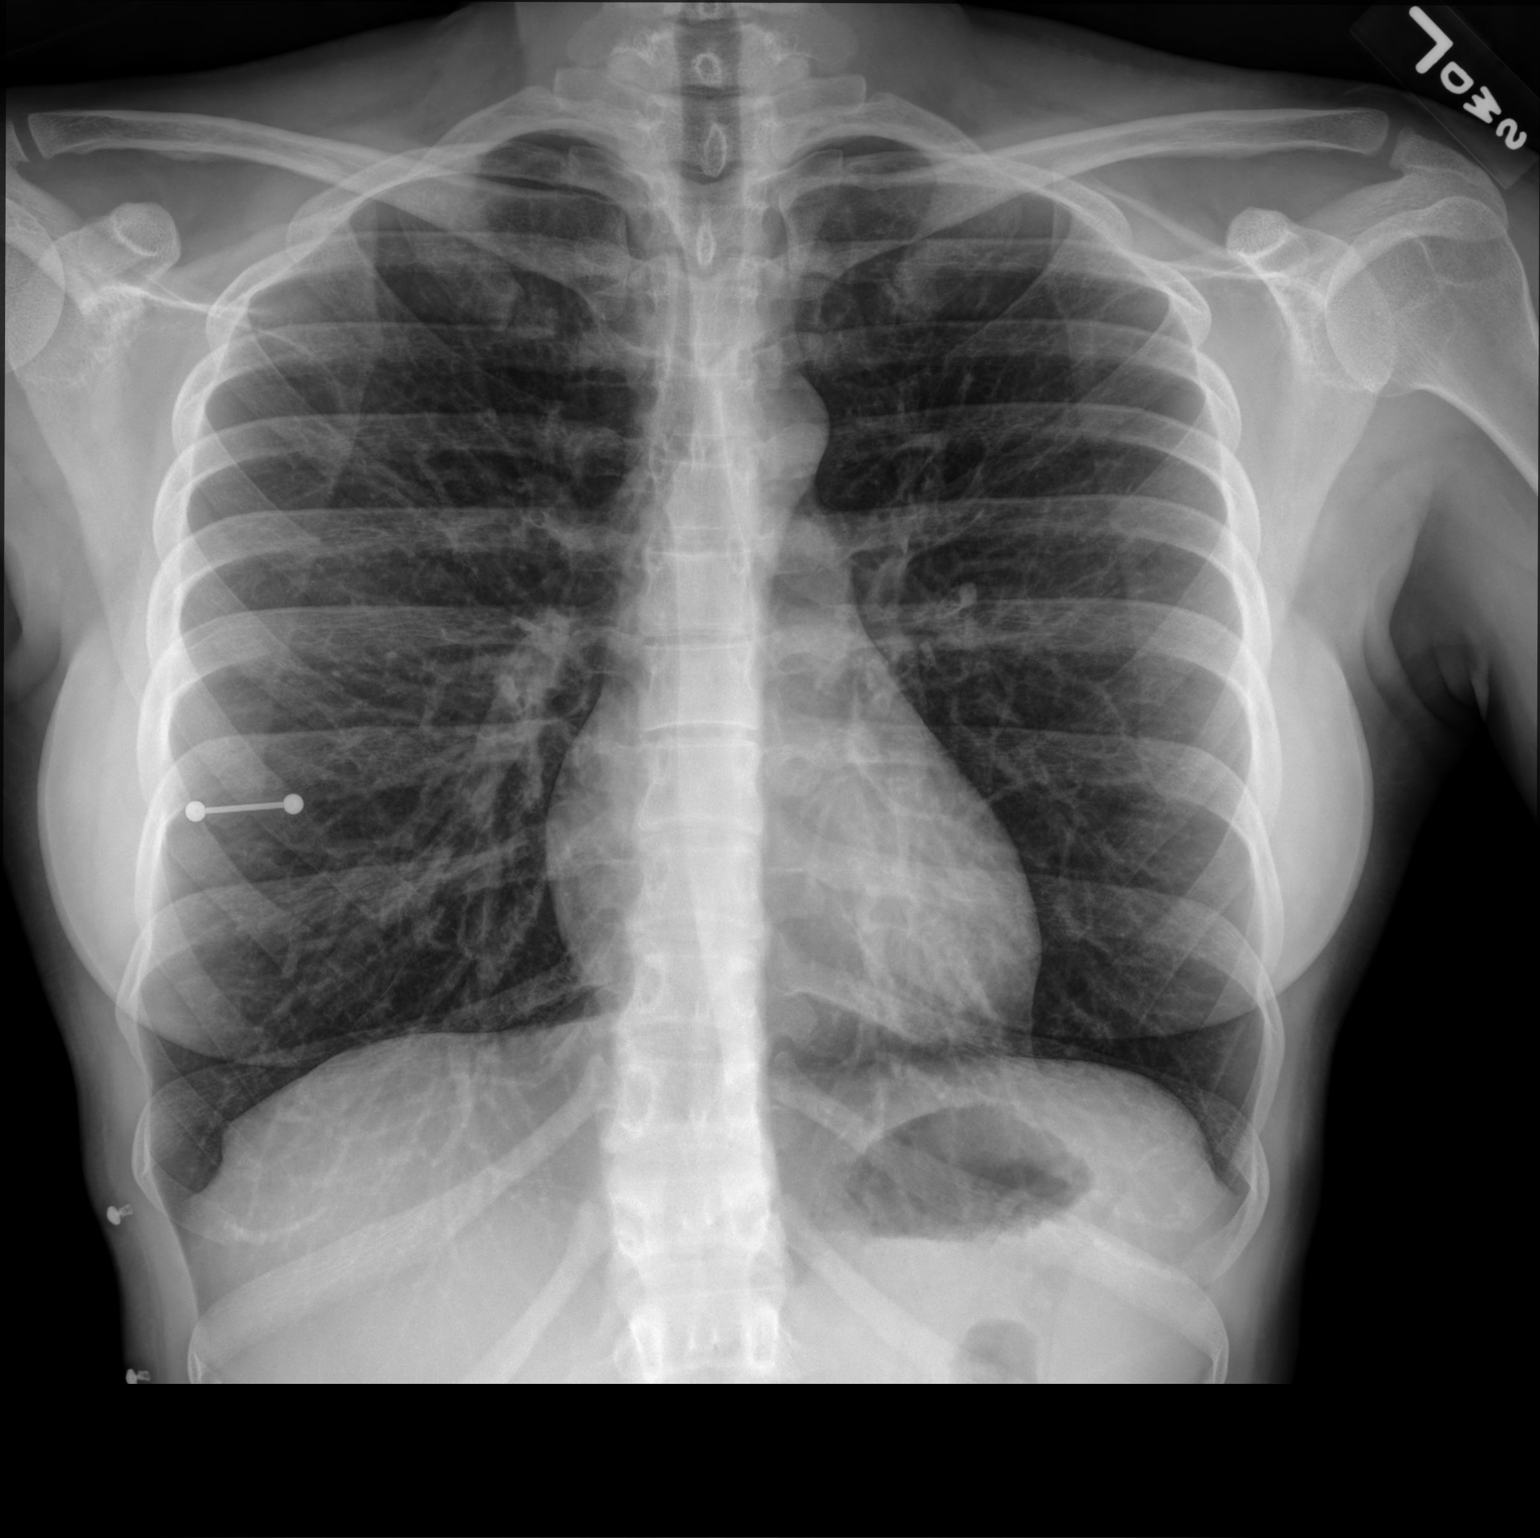

[chest lat]
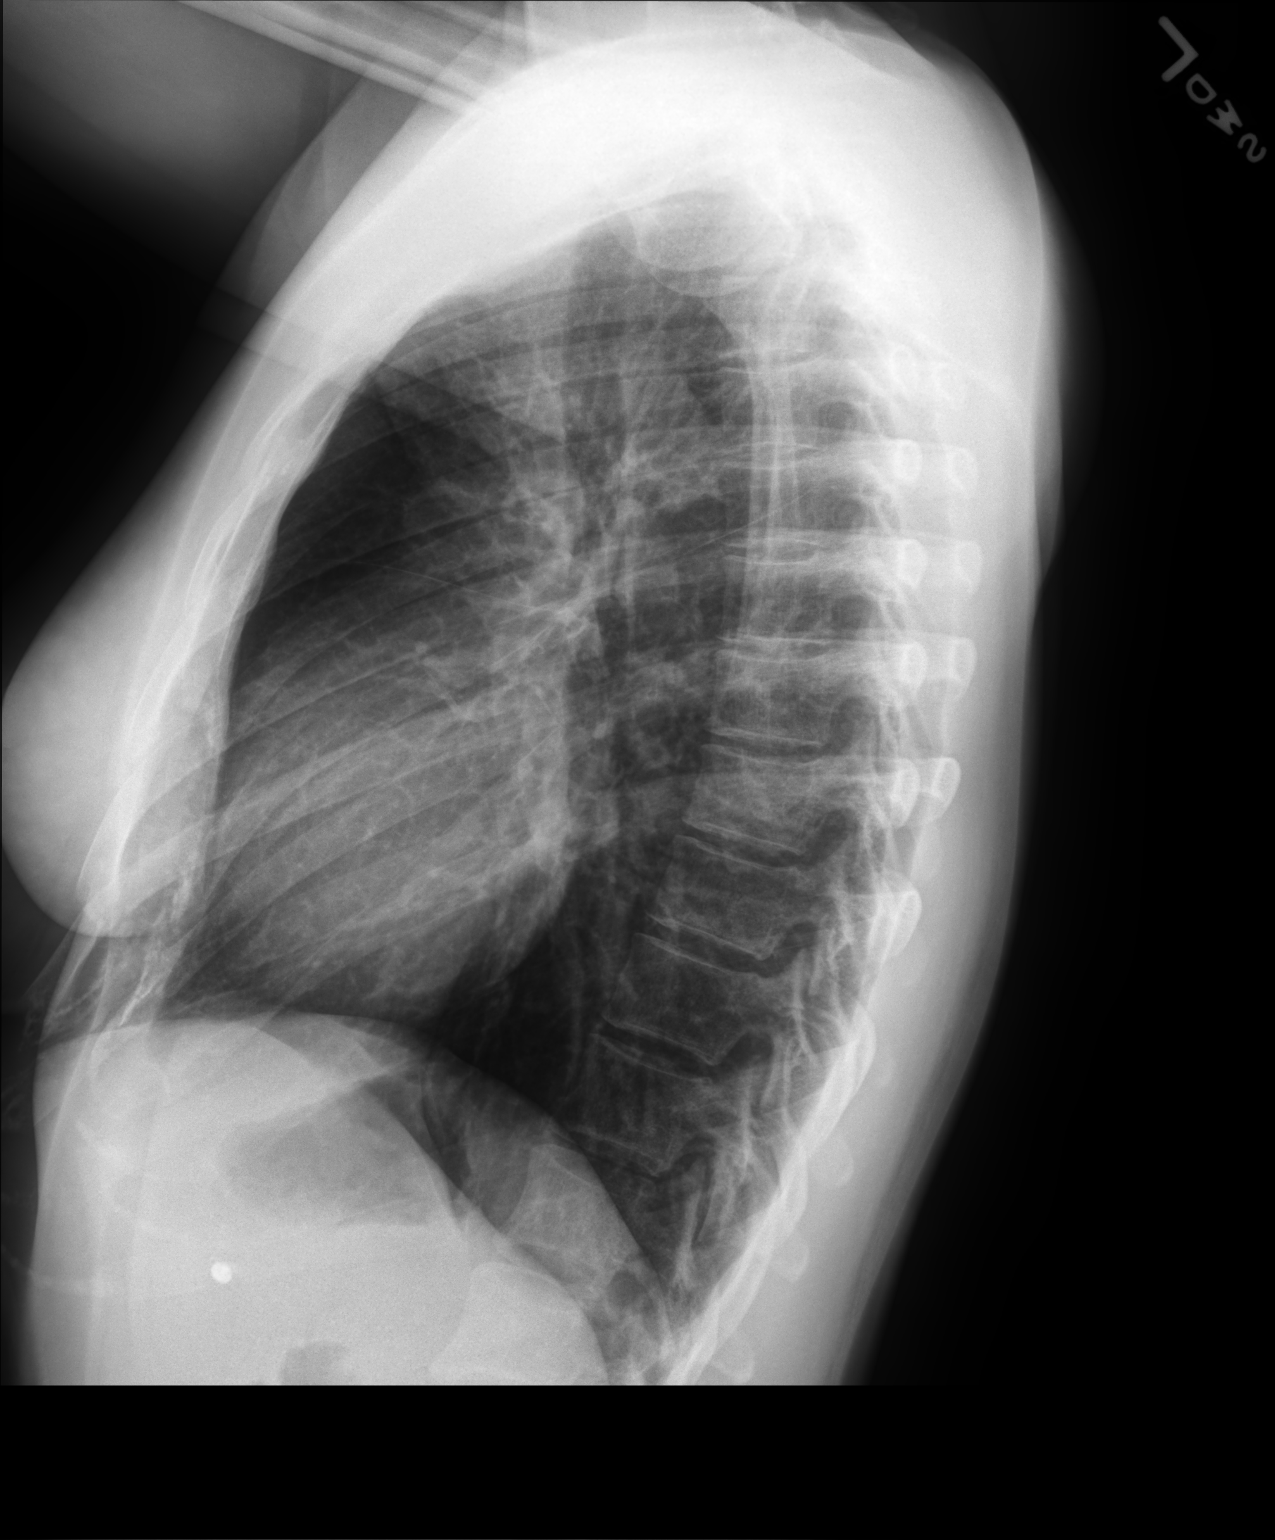

[2 of 2 positions shown; findings below may reference images not displayed]

FINDINGS: The heart size and mediastinal contours are within normal limits.
Both lungs are clear. The visualized skeletal structures are
unremarkable.
IMPRESSION: No active cardiopulmonary disease.  No evidence of pneumonia.

## 2019-10-02 ENCOUNTER — Inpatient Hospital Stay: Admission: RE | Admit: 2019-10-02 | Payer: BC Managed Care – PPO | Source: Ambulatory Visit

## 2020-05-03 ENCOUNTER — Other Ambulatory Visit (HOSPITAL_COMMUNITY): Payer: Self-pay | Admitting: Family Medicine

## 2020-05-03 MED FILL — NAPROXEN 500 MG TABLET: 500 | 10 days supply | Qty: 20 | Fill #0

## 2020-05-10 ENCOUNTER — Other Ambulatory Visit (HOSPITAL_COMMUNITY): Payer: Self-pay

## 2020-05-10 MED FILL — CHLORHEXIDINE 0.12% RINSE: 0.12 | 17 days supply | Qty: 473 | Fill #0

## 2020-05-12 ENCOUNTER — Encounter (HOSPITAL_BASED_OUTPATIENT_CLINIC_OR_DEPARTMENT_OTHER): Payer: Self-pay

## 2020-05-12 ENCOUNTER — Emergency Department (HOSPITAL_BASED_OUTPATIENT_CLINIC_OR_DEPARTMENT_OTHER)
Admission: EM | Admit: 2020-05-12 | Discharge: 2020-05-12 | Disposition: A | Discharge status: Home / Self Care | Payer: 59 | Attending: Emergency Medicine | Admitting: Emergency Medicine

## 2020-05-12 ENCOUNTER — Other Ambulatory Visit: Payer: Self-pay

## 2020-05-12 DIAGNOSIS — M546 Pain in thoracic spine: Secondary | ICD-10-CM | POA: Insufficient documentation

## 2020-05-12 DIAGNOSIS — M25532 Pain in left wrist: Secondary | ICD-10-CM

## 2020-05-12 DIAGNOSIS — M549 Dorsalgia, unspecified: Secondary | ICD-10-CM

## 2020-05-12 MED ORDER — ACETAMINOPHEN 325 MG PO TABS
650.0000 mg | ORAL_TABLET | Freq: Once | ORAL | Status: AC
  Administered 2020-05-12: 650 mg via ORAL
  Filled 2020-05-12: qty 2
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
## 2020-05-12 NOTE — Discharge Instructions (Signed)
As discussed, please take Tylenol for pain.  He may also apply heat/ice to the affected areas, depending on which helps to better.  He may also do some gentle stretching.  You may benefit from some physical therapy, however I highly recommend that you follow-up with a primary care doctor or orthopedics.  Please return to the ER for any new or worsening symptoms.
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
## 2020-05-12 NOTE — ED Notes (Signed)
Velcro wrist splint applied.  Good CMS after application
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
## 2023-12-11 ENCOUNTER — Other Ambulatory Visit (HOSPITAL_COMMUNITY): Payer: Self-pay

## 2024-04-14 ENCOUNTER — Other Ambulatory Visit (HOSPITAL_COMMUNITY): Payer: Self-pay
